# Patient Record
Sex: Female | Born: 1985 | Race: White | Hispanic: No | Marital: Single | Smoking: Never smoker
Health system: Southern US, Community
[De-identification: ages and names within clinical notes are randomized; demographics above are authoritative.]

## PROBLEM LIST (undated history)

## (undated) DIAGNOSIS — F909 Attention-deficit hyperactivity disorder, unspecified type: Secondary | ICD-10-CM

## (undated) DIAGNOSIS — E039 Hypothyroidism, unspecified: Secondary | ICD-10-CM

## (undated) DIAGNOSIS — N912 Amenorrhea, unspecified: Secondary | ICD-10-CM

## (undated) DIAGNOSIS — F419 Anxiety disorder, unspecified: Secondary | ICD-10-CM

## (undated) DIAGNOSIS — F32A Depression, unspecified: Secondary | ICD-10-CM

## (undated) DIAGNOSIS — F840 Autistic disorder: Secondary | ICD-10-CM

## (undated) DIAGNOSIS — F329 Major depressive disorder, single episode, unspecified: Secondary | ICD-10-CM

## (undated) HISTORY — PX: WISDOM TOOTH EXTRACTION: SHX21

## (undated) HISTORY — DX: Major depressive disorder, single episode, unspecified: F32.9

## (undated) HISTORY — DX: Autistic disorder: F84.0

## (undated) HISTORY — DX: Hypothyroidism, unspecified: E03.9

## (undated) HISTORY — PX: TYMPANOPLASTY: SHX33

## (undated) HISTORY — DX: Amenorrhea, unspecified: N91.2

## (undated) HISTORY — DX: Depression, unspecified: F32.A

---
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
## 2005-01-13 ENCOUNTER — Ambulatory Visit: Payer: Self-pay | Admitting: Family Medicine

## 2005-05-15 ENCOUNTER — Ambulatory Visit: Payer: Self-pay | Admitting: Family Medicine

## 2012-06-24 ENCOUNTER — Other Ambulatory Visit: Payer: Self-pay

## 2012-06-24 ENCOUNTER — Other Ambulatory Visit: Payer: Self-pay | Admitting: Otolaryngology

## 2012-06-24 DIAGNOSIS — J329 Chronic sinusitis, unspecified: Secondary | ICD-10-CM

## 2012-06-27 ENCOUNTER — Inpatient Hospital Stay: Admission: RE | Admit: 2012-06-27 | Payer: Self-pay | Source: Ambulatory Visit

## 2012-06-30 ENCOUNTER — Other Ambulatory Visit: Payer: Self-pay

## 2012-07-04 ENCOUNTER — Ambulatory Visit
Admission: RE | Admit: 2012-07-04 | Discharge: 2012-07-04 | Disposition: A | Discharge status: Home / Self Care | Payer: BC Managed Care – PPO | Source: Ambulatory Visit | Attending: Otolaryngology | Admitting: Otolaryngology

## 2012-07-04 DIAGNOSIS — J329 Chronic sinusitis, unspecified: Secondary | ICD-10-CM

## 2012-07-05 ENCOUNTER — Encounter (HOSPITAL_COMMUNITY): Payer: Self-pay | Admitting: Pharmacist

## 2012-07-08 ENCOUNTER — Encounter (HOSPITAL_COMMUNITY): Payer: Self-pay | Admitting: *Deleted

## 2012-07-15 ENCOUNTER — Encounter (HOSPITAL_COMMUNITY): Payer: Self-pay | Admitting: Obstetrics and Gynecology

## 2012-07-15 NOTE — H&P (Addendum)
Jacqueline Odonnell is an 26 y.o. female.   Chief Complaint: 26 yo SWF G0P0 can't use tampons HPI: menses regular but pt unable to find an opening at vagina to use tampons  Past Medical History  Diagnosis Date  . Anxiety   . Hypothyroidism   . Autism spectrum disorder   . ADHD (attention deficit hyperactivity disorder)     Past Surgical History  Procedure Date  . Wisdom tooth extraction   . Tympanoplasty     History reviewed. No pertinent family history. Social History:  reports that she has never smoked. She has never used smokeless tobacco. She reports that she does not drink alcohol or use illicit drugs.  Allergies:  Allergies  Allergen Reactions  . Mineral Oil Rash    No prescriptions prior to admission    No results found for this or any previous visit (from the past 48 hour(s)). No results found.  Review of Systems  All other systems reviewed and are negative.    There were no vitals taken for this visit. Physical Exam  Constitutional: She is oriented to person, place, and time. She appears well-developed and well-nourished.  HENT:  Head: Normocephalic and atraumatic.  Eyes: Conjunctivae normal are normal.  Neck: Neck supple. No thyromegaly present.  Cardiovascular: Normal rate, regular rhythm and normal heart sounds.   Respiratory: Effort normal and breath sounds normal.  GI: Soft. Bowel sounds are normal.  Genitourinary: Uterus normal.       Unable to ascertain a vaginal opening.  Rectal exam does reveal a palpably no uterus and cervix and no adnexal masses  Musculoskeletal: Normal range of motion.  Neurological: She is alert and oriented to person, place, and time.  Skin: Skin is warm and dry.  Psychiatric: She has a normal mood and affect.     Assessment/Plan microperforate hymen, for hymenotomy.    ROMINE,CYNTHIA P 07/15/2012, 1:26 PM   No change in H&P

## 2012-07-18 ENCOUNTER — Encounter (HOSPITAL_COMMUNITY): Payer: Self-pay | Admitting: Anesthesiology

## 2012-07-18 ENCOUNTER — Encounter (HOSPITAL_COMMUNITY)
Admission: RE | Disposition: A | Discharge status: Home / Self Care | Payer: Self-pay | Source: Ambulatory Visit | Attending: Gynecology

## 2012-07-18 ENCOUNTER — Ambulatory Visit (HOSPITAL_COMMUNITY): Payer: BC Managed Care – PPO | Admitting: Anesthesiology

## 2012-07-18 ENCOUNTER — Ambulatory Visit (HOSPITAL_COMMUNITY)
Admission: RE | Admit: 2012-07-18 | Discharge: 2012-07-18 | Disposition: A | Discharge status: Home / Self Care | Payer: BC Managed Care – PPO | Source: Ambulatory Visit | Attending: Gynecology | Admitting: Gynecology

## 2012-07-18 DIAGNOSIS — Z01419 Encounter for gynecological examination (general) (routine) without abnormal findings: Secondary | ICD-10-CM | POA: Insufficient documentation

## 2012-07-18 DIAGNOSIS — Q523 Imperforate hymen: Secondary | ICD-10-CM | POA: Insufficient documentation

## 2012-07-18 HISTORY — DX: Autistic disorder: F84.0

## 2012-07-18 HISTORY — PX: EXAMINATION UNDER ANESTHESIA: SHX1540

## 2012-07-18 HISTORY — DX: Hypothyroidism, unspecified: E03.9

## 2012-07-18 HISTORY — PX: HYMENECTOMY: SHX5853

## 2012-07-18 HISTORY — DX: Anxiety disorder, unspecified: F41.9

## 2012-07-18 HISTORY — DX: Attention-deficit hyperactivity disorder, unspecified type: F90.9

## 2012-07-18 LAB — CBC
MCH: 27.4 pg (ref 26.0–34.0)
MCV: 82 fL (ref 78.0–100.0)
Platelets: 390 10*3/uL (ref 150–400)
RDW: 14.3 % (ref 11.5–15.5)
WBC: 9.1 10*3/uL (ref 4.0–10.5)

## 2012-07-18 LAB — HCG, SERUM, QUALITATIVE: Preg, Serum: NEGATIVE

## 2012-07-18 SURGERY — EXAM UNDER ANESTHESIA
Anesthesia: Monitor Anesthesia Care | Site: Vagina | Wound class: Clean Contaminated

## 2012-07-18 MED ORDER — LIDOCAINE HCL (CARDIAC) 20 MG/ML IV SOLN
INTRAVENOUS | Status: DC | PRN
  Administered 2012-07-18: 20 mg via INTRAVENOUS

## 2012-07-18 MED ORDER — ESTRADIOL 0.1 MG/GM VA CREA
1.0000 | TOPICAL_CREAM | Freq: Once | VAGINAL | Status: AC
  Administered 2012-07-18: 1 via VAGINAL

## 2012-07-18 MED ORDER — METOCLOPRAMIDE HCL 5 MG/ML IJ SOLN: 10.0000 mg | Freq: Once | INTRAMUSCULAR | Status: DC | PRN

## 2012-07-18 MED ORDER — ESTRADIOL 0.1 MG/GM VA CREA
TOPICAL_CREAM | VAGINAL | Status: AC
  Filled 2012-07-18: qty 42.5

## 2012-07-18 MED ORDER — MEPERIDINE HCL 25 MG/ML IJ SOLN: 6.2500 mg | INTRAMUSCULAR | Status: DC | PRN

## 2012-07-18 MED ORDER — MIDAZOLAM HCL 2 MG/2ML IJ SOLN
INTRAMUSCULAR | Status: AC
  Filled 2012-07-18: qty 2

## 2012-07-18 MED ORDER — PROPOFOL 10 MG/ML IV EMUL
INTRAVENOUS | Status: DC | PRN
  Administered 2012-07-18: 140 ug/kg/min via INTRAVENOUS

## 2012-07-18 MED ORDER — LIDOCAINE HCL 2 % IJ SOLN
INTRAMUSCULAR | Status: AC
  Filled 2012-07-18: qty 20

## 2012-07-18 MED ORDER — LACTATED RINGERS IV SOLN
INTRAVENOUS | Status: DC
  Administered 2012-07-18 (×3): via INTRAVENOUS

## 2012-07-18 MED ORDER — KETOROLAC TROMETHAMINE 30 MG/ML IJ SOLN
INTRAMUSCULAR | Status: AC
  Filled 2012-07-18: qty 1

## 2012-07-18 MED ORDER — FENTANYL CITRATE 0.05 MG/ML IJ SOLN
INTRAMUSCULAR | Status: DC | PRN
  Administered 2012-07-18 (×2): 50 ug via INTRAVENOUS
  Administered 2012-07-18 (×2): 25 ug via INTRAVENOUS

## 2012-07-18 MED ORDER — MIDAZOLAM HCL 5 MG/5ML IJ SOLN
INTRAMUSCULAR | Status: DC | PRN
  Administered 2012-07-18: 2 mg via INTRAVENOUS

## 2012-07-18 MED ORDER — ONDANSETRON HCL 4 MG/2ML IJ SOLN
INTRAMUSCULAR | Status: AC
  Filled 2012-07-18: qty 2

## 2012-07-18 MED ORDER — PROPOFOL 10 MG/ML IV EMUL
INTRAVENOUS | Status: AC
  Filled 2012-07-18: qty 20

## 2012-07-18 MED ORDER — FENTANYL CITRATE 0.05 MG/ML IJ SOLN: 25.0000 ug | INTRAMUSCULAR | Status: DC | PRN

## 2012-07-18 MED ORDER — LIDOCAINE HCL (CARDIAC) 20 MG/ML IV SOLN
INTRAVENOUS | Status: AC
  Filled 2012-07-18: qty 5

## 2012-07-18 MED ORDER — FENTANYL CITRATE 0.05 MG/ML IJ SOLN
INTRAMUSCULAR | Status: AC
  Filled 2012-07-18: qty 4

## 2012-07-18 MED ORDER — PROPOFOL 10 MG/ML IV EMUL
INTRAVENOUS | Status: DC | PRN
  Administered 2012-07-18 (×4): 20 mg via INTRAVENOUS
  Administered 2012-07-18: 40 mg via INTRAVENOUS

## 2012-07-18 MED ORDER — ONDANSETRON HCL 4 MG/2ML IJ SOLN
INTRAMUSCULAR | Status: DC | PRN
  Administered 2012-07-18: 4 mg via INTRAVENOUS

## 2012-07-18 MED ORDER — PROPOFOL 10 MG/ML IV EMUL
INTRAVENOUS | Status: AC
  Filled 2012-07-18: qty 40

## 2012-07-18 MED ORDER — LIDOCAINE HCL 2 % IJ SOLN
INTRAMUSCULAR | Status: DC | PRN
  Administered 2012-07-18: 10 mL

## 2012-07-18 SURGICAL SUPPLY — 18 items
DECANTER SPIKE VIAL GLASS SM (MISCELLANEOUS) ×1 IMPLANT
ELECT NDL TIP 2.8 STRL (NEEDLE) IMPLANT
ELECT NEEDLE TIP 2.8 STRL (NEEDLE) ×4 IMPLANT
ELECT REM PT RETURN 9FT ADLT (ELECTROSURGICAL) ×2
ELECTRODE REM PT RTRN 9FT ADLT (ELECTROSURGICAL) IMPLANT
GAUZE PACKING 1 X5 YD ST (GAUZE/BANDAGES/DRESSINGS) ×1 IMPLANT
GLOVE BIO SURGEON STRL SZ7 (GLOVE) ×1 IMPLANT
GLOVE ECLIPSE 6.5 STRL STRAW (GLOVE) ×1 IMPLANT
GLOVE ECLIPSE 7.0 STRL STRAW (GLOVE) ×1 IMPLANT
GOWN STRL NON-REIN LRG LVL3 (GOWN DISPOSABLE) ×3 IMPLANT
NEEDLE HYPO 22GX1.5 SAFETY (NEEDLE) ×1 IMPLANT
PACK VAGINAL MINOR WOMEN LF (CUSTOM PROCEDURE TRAY) ×1 IMPLANT
PAD OB MATERNITY 4.3X12.25 (PERSONAL CARE ITEMS) ×1 IMPLANT
PENCIL BUTTON HOLSTER BLD 10FT (ELECTRODE) ×1 IMPLANT
SUT MON AB 2-0 SH 27 (SUTURE) ×2
SUT MON AB 2-0 SH27 (SUTURE) IMPLANT
TOWEL OR 17X24 6PK STRL BLUE (TOWEL DISPOSABLE) ×2 IMPLANT
WATER STERILE IRR 1000ML POUR (IV SOLUTION) ×1 IMPLANT

## 2012-07-18 NOTE — Transfer of Care (Signed)
Immediate Anesthesia Transfer of Care Note  Patient: Jacqueline Odonnell  Procedure(s) Performed: Procedure(s) (LRB) with comments: EXAM UNDER ANESTHESIA (N/A) - with Pap Smear HYMENECTOMY (N/A)  Patient Location: PACU  Anesthesia Type:MAC  Level of Consciousness: awake, alert , oriented and patient cooperative  Airway & Oxygen Therapy: Patient Spontanous Breathing  Post-op Assessment: Report given to PACU RN and Post -op Vital signs reviewed and stable  Post vital signs: Reviewed and stable  Complications: No apparent anesthesia complications

## 2012-07-18 NOTE — Brief Op Note (Signed)
07/18/2012  12:44 PM  PATIENT:  Jacqueline Odonnell  26 y.o. female  PRE-OPERATIVE DIAGNOSIS:  Imperforated Hymen  POST-OPERATIVE DIAGNOSIS:  Imperforated Hymen  PROCEDURE:  Procedure(s) (LRB) with comments: EXAM UNDER ANESTHESIA (N/A) - with Pap Smear HYMENECTOMY (N/A)  SURGEON:  Surgeon(s) and Role:    * Bennye Alm, MD - Primary  PHYSICIAN ASSISTANT:   ASSISTANTS: none   ANESTHESIA:   local and IV sedation  EBL:  Total I/O In: 1000 [I.V.:1000] Out: 400 [Urine:400]  BLOOD ADMINISTERED:none  DRAINS: none   LOCAL MEDICATIONS USED:  LIDOCAINE 2% 10cc   SPECIMEN:  No Specimen  DISPOSITION OF SPECIMEN:  N/A  COUNTS:  YES  TOURNIQUET:  * No tourniquets in log *  DICTATION: .Other Dictation: Dictation Number [id number]  PLAN OF CARE: Discharge to home after PACU  PATIENT DISPOSITION:  PACU - hemodynamically stable.   Delay start of Pharmacological VTE agent (>24hrs) due to surgical blood loss or risk of bleeding: not applicable

## 2012-07-18 NOTE — Anesthesia Postprocedure Evaluation (Signed)
  Anesthesia Post-op Note  Patient: Jacqueline Odonnell  Procedure(s) Performed: Procedure(s) (LRB) with comments: EXAM UNDER ANESTHESIA (N/A) - with Pap Smear HYMENECTOMY (N/A)  Patient Location: PACU  Anesthesia Type:MAC  Level of Consciousness: awake, alert  and oriented  Airway and Oxygen Therapy: Patient Spontanous Breathing  Post-op Pain: none  Post-op Assessment: Post-op Vital signs reviewed, Patient's Cardiovascular Status Stable, Respiratory Function Stable, Patent Airway, No signs of Nausea or vomiting and Pain level controlled  Post-op Vital Signs: Reviewed and stable  Complications: No apparent anesthesia complications

## 2012-07-18 NOTE — Anesthesia Preprocedure Evaluation (Signed)
Anesthesia Evaluation  Patient identified by MRN, date of birth, ID band Patient awake    Reviewed: Allergy & Precautions, H&P , NPO status , Patient's Chart, lab work & pertinent test results  Airway Mallampati: I TM Distance: >3 FB Neck ROM: Full    Dental No notable dental hx. (+) Teeth Intact   Pulmonary  breath sounds clear to auscultation  Pulmonary exam normal       Cardiovascular negative cardio ROS  Rhythm:Regular Rate:Normal     Neuro/Psych PSYCHIATRIC DISORDERS Anxiety ADHD   GI/Hepatic negative GI ROS, Neg liver ROS,   Endo/Other  Hypothyroidism   Renal/GU negative Renal ROS  negative genitourinary   Musculoskeletal negative musculoskeletal ROS (+)   Abdominal (+) + obese,   Peds  Hematology negative hematology ROS (+)   Anesthesia Other Findings   Reproductive/Obstetrics negative OB ROS                           Anesthesia Physical Anesthesia Plan  ASA: II  Anesthesia Plan: MAC   Post-op Pain Management:    Induction: Intravenous  Airway Management Planned: Natural Airway  Additional Equipment:   Intra-op Plan:   Post-operative Plan: Extubation in OR  Informed Consent: I have reviewed the patients History and Physical, chart, labs and discussed the procedure including the risks, benefits and alternatives for the proposed anesthesia with the patient or authorized representative who has indicated his/her understanding and acceptance.   Dental advisory given  Plan Discussed with: CRNA, Anesthesiologist and Surgeon  Anesthesia Plan Comments:         Anesthesia Quick Evaluation

## 2012-07-19 NOTE — Op Note (Signed)
NAMEMARONDA, CAISON               ACCOUNT NO.:  [id number]  MEDICAL RECORD NO.:  [id number]  LOCATION:  [facility]                          FACILITY:  [facility]  PHYSICIAN:  Ivor Costa. Farrel Gobble, M.D. DATE OF BIRTH:  April 28, 1986  DATE OF PROCEDURE:  07/18/2012 DATE OF DISCHARGE:  07/18/2012                              OPERATIVE REPORT   PREOPERATIVE DIAGNOSIS:  Imperforate hymen.  POSTOPERATIVE DIAGNOSIS:  Imperforate hymen.  PROCEDURE: 1. Exam under anesthesia. 2. Hymenectomy. 3. Pap smear.  SURGEON:  Ivor Costa. Farrel Gobble, M.D.  ASSISTANT:  None.  ANESTHESIA:  IV sedation and 2% lidocaine 10 mL local.  IV FLUIDS:  1 L Lactated Ringer's.  ESTIMATED BLOOD LOSS:  Less than 10 mL.  URINE OUTPUT:  400 mL.  FINDINGS:  Almost complete imperforate hymen was noted.  There was no outpouching of the hyaline membrane.  The rectal exam confirmed pelvic anatomy Normal cervix, anteverted uterus appreciated at the end of the procedure.  PATHOLOGY:  None.  PROCEDURE:  The patient was taken to the operating room.  IV sedation was induced and placed in the dorsal lithotomy position and prepped and draped in the usual sterile fashion.  After adequate anesthesia was assured, an extensive exam of the posterior external vaginal genitalia was performed, it appeared to be what seemed like entirely solid imperforate hymen.  However, by history, the patient did occasionally have menses.  Rectal exam was performed.  The uterosacral ligaments were felt and the cervix were palpable.  The patient was then prepped and draped in the usual sterile fashion.  Gloves were exchanged.  Using lacrimal duct dilators what look like indentations in the hymenal area where probed in a single opening was identified just inferior to the bladder.  The probe was then advanced cephalad to confirm what appeared to be normal vaginal length, which was appreciated.  The hemostat tips were able to be advanced through the opening which  was gently extended with pressure.  The tips were pointed inferiorly then using a needle tip cut on the Bovie set at 30 and incision was made into the hymen, which was noted to be markedly thick in the most superior aspect.  An examination was performed at this point and the aperture of the vagina was better delineated.  The vaginal opening was tight without give anteriorly.  The region behind the hymen was then injected with 10cc of 2% lidocaine without epi. The hymen was then held with Allis clamps and an incision was then made removing the thick membranous hymen circumferentially creating more normal vaginal opening.  The tissue was passed off.   A speculum was then placed.  A cervix was visualized.  A Pap smear was performed.  Bimanual exam was similarly performed and was noted as above.  The vaginal mucosa was then plicated to the edge of the vulva with 3-0 Monocryl.  The inferior aspect was done interrupted to not tighten the vaginal opening.  The suture line was treated with packing with Estrace cream.  The bladder was emptied at the beginning and again at the end of the procedure.  The patient tolerated the procedure well.  Sponges, lap and needle counts were correct.  She was transferred to the recovery room in stable condition.     Ivor Costa. Farrel Gobble, M.D.     THL/MEDQ  D:  07/18/2012  T:  07/18/2012  Job:  [id number]

## 2012-07-20 ENCOUNTER — Encounter (HOSPITAL_COMMUNITY): Payer: Self-pay | Admitting: Gynecology

## 2012-10-07 ENCOUNTER — Telehealth: Payer: Self-pay | Admitting: Gynecology

## 2012-10-07 NOTE — Telephone Encounter (Signed)
PT IS HAVING CYCLE ISSUES

## 2012-10-07 NOTE — Telephone Encounter (Signed)
Patient concerned that she has not had a cycle in 2 months and has to always have progestin to make it start . Cycle is always normal , without any problems. Concerned that she always has to have progestin and her cycle will not start on its on naturally. Please advise. Jacqueline Odonnell. Will put chart on book case in office.

## 2012-10-13 ENCOUNTER — Encounter: Payer: Self-pay | Admitting: Obstetrics and Gynecology

## 2012-10-13 ENCOUNTER — Ambulatory Visit: Payer: Self-pay | Admitting: Gynecology

## 2012-10-13 ENCOUNTER — Telehealth: Payer: Self-pay | Admitting: Gynecology

## 2012-10-13 DIAGNOSIS — F329 Major depressive disorder, single episode, unspecified: Secondary | ICD-10-CM | POA: Insufficient documentation

## 2012-10-13 DIAGNOSIS — E039 Hypothyroidism, unspecified: Secondary | ICD-10-CM | POA: Insufficient documentation

## 2012-10-13 NOTE — Telephone Encounter (Signed)
Pt is still waiting on a call from Monday regarding an appt with dr lathrop.

## 2012-10-13 NOTE — Telephone Encounter (Signed)
Spoke with pt who has been unable to have a period without taking progesterone first. LMP 07-31-12. Requests OV. Sced appt with TL today at 1445. aa

## 2012-10-14 ENCOUNTER — Ambulatory Visit (INDEPENDENT_AMBULATORY_CARE_PROVIDER_SITE_OTHER): Payer: BC Managed Care – PPO | Admitting: Gynecology

## 2012-10-14 ENCOUNTER — Encounter: Payer: Self-pay | Admitting: Gynecology

## 2012-10-14 ENCOUNTER — Ambulatory Visit: Payer: Self-pay | Admitting: Gynecology

## 2012-10-14 VITALS — BP 118/60 | Resp 18 | Wt 176.0 lb

## 2012-10-14 DIAGNOSIS — F845 Asperger's syndrome: Secondary | ICD-10-CM

## 2012-10-14 DIAGNOSIS — F848 Other pervasive developmental disorders: Secondary | ICD-10-CM

## 2012-10-14 DIAGNOSIS — N912 Amenorrhea, unspecified: Secondary | ICD-10-CM

## 2012-10-14 DIAGNOSIS — N911 Secondary amenorrhea: Secondary | ICD-10-CM

## 2012-10-14 LAB — POCT URINE PREGNANCY: Preg Test, Ur: NEGATIVE

## 2012-10-14 MED ORDER — MEDROXYPROGESTERONE ACETATE 10 MG PO TABS: 10.0000 mg | ORAL_TABLET | Freq: Every day | ORAL | Status: DC

## 2012-10-14 NOTE — Progress Notes (Signed)
Subjective:     Patient ID: Jacqueline Odonnell, female   DOB: 1986/06/05, 27 y.o.   MRN: [id number]  HPI Comments: Pt is a 6y old female s/p hymenectomy December 30 for a mostly imperforate hymen, pt had history of regular menses Q31, 7d flow, heavier the fisrt few days.  Pt states that since surgery she has had 1 cycle brought on by provera.  Pt started needing progestin about 1y ago.  She has had no spontaneous cycle since January.  Pt is lesbian.  Pt had hot flashes in the past but have since resolved after discontinuing amantadine.  Pt reports history of thyroid disease last checked 1y ago, no recent change in synthoid dose.  Pt reports feeling tired, low energy but doesn't feel it is related to school.  Had weight gain after stopping topamax and illness.  Reports increase in acne, hair growth and gi upset when fatigued.  Pt reports new onset chest hair on breasts-terminal, darker than hair on arms and legs.  Pt reports needing to wax facial hair q1-2 weeks, increase from the past.  Menses began at 15, regular.  Pt was already on multiple medications at that time.    Review of Systems  Constitutional: Positive for activity change and fatigue.  Gastrointestinal: Positive for nausea, abdominal pain and diarrhea. Negative for constipation and abdominal distention. Vomiting: associated with hormonal.  Endocrine: Positive for heat intolerance.  Allergic/Immunologic: Negative.   Neurological: Positive for dizziness.  Hematological: Does not bruise/bleed easily.  Psychiatric/Behavioral: Positive for sleep disturbance (unable to stay asleep wakes at 1a, poor sleep).       Objective:   Physical Exam  Constitutional: She is oriented to person, place, and time. She appears well-developed and well-nourished.  Neurological: She is alert and oriented to person, place, and time.  Psychiatric: She has a normal mood and affect. Her behavior is normal. Thought content normal.  Skin:  Terminal hair on face,  chin, upper thighs and abdomen, no hair on back.  Acne scars GYN:  Normal external genitalia, BUS neg BME:  Uterus anteverted, mobile, nontender.  Adnexa without mass     Assessment:     Secondary amenorrhea Hypothyroid Ashberger's with polypharmacy    Plan:     Will check TSH, PRL, FSH Rule out metabolic syndrome-fasting insulin, glucose, testosterone, DHEAS, 17-OHP Pelvic ultrasound Discussed options to treat, withdrawal now with progestin  Over 60 min spent, >50% face to face discusing amenorrhea

## 2012-10-14 NOTE — Patient Instructions (Signed)
Secondary Amenorrhea   Secondary amenorrhea is the stopping of menstrual flow for 3 to 6 months in a female who has previously had periods. There are many possible causes. Most of these causes are not serious. Usually treating the underlying problem causing the loss of menses will return your periods to normal.  CAUSES   Some common and uncommon causes of not menstruating include:  · Malnutrition.  · Low blood sugar (hypoglycemia).  · Polycystic ovarian disease.  · Stress or fear.  · Breastfeeding.  · Hormone imbalance.  · Ovarian failure.  · Medications.  · Extreme obesity.  · Cystic fibrosis.  · Low body weight or drastic weight reduction from any cause.  · Early menopause.  · Removal of ovaries or uterus.  · Contraceptives.  · Illness.  · Long term (chronic) illnesses.  · Cushing's syndrome.  · Thyroid problems.  · Birth control pills, patches, or vaginal rings for birth control.  DIAGNOSIS   This diagnosis is made by your caregiver taking a medical history and doing a physical exam. Pregnancy must be ruled out. Often times, numerous blood tests of different hormones in the body may be measured. Urine testing may be done. Specialized x-rays may have to be done as well as measuring the body mass index (BMI).  TREATMENT   Treatment depends on the cause of the amenorrhea. If an eating disorder is present, this can be treated with an adequate diet and therapy. Chronic illnesses may improve with treatment of the illness. Overall, the outlook is good. The amenorrhea may be corrected with medications, lifestyle changes, or surgery. If the amenorrhea cannot be corrected, it is sometimes possible to create a false menstruation with medications.  Document Released: 08/17/2006 Document Revised: 09/28/2011 Document Reviewed: 06/24/2007  ExitCare® Patient Information ©2013 [employer].

## 2012-10-17 ENCOUNTER — Ambulatory Visit: Payer: Self-pay

## 2012-10-17 ENCOUNTER — Telehealth: Payer: Self-pay | Admitting: *Deleted

## 2012-10-17 ENCOUNTER — Other Ambulatory Visit: Payer: Self-pay | Admitting: Gynecology

## 2012-10-17 ENCOUNTER — Encounter: Payer: Self-pay | Admitting: *Deleted

## 2012-10-17 ENCOUNTER — Ambulatory Visit (INDEPENDENT_AMBULATORY_CARE_PROVIDER_SITE_OTHER): Payer: BC Managed Care – PPO

## 2012-10-17 DIAGNOSIS — N912 Amenorrhea, unspecified: Secondary | ICD-10-CM

## 2012-10-17 LAB — BASIC METABOLIC PANEL
BUN: 12 mg/dL (ref 6–23)
CO2: 26 mEq/L (ref 19–32)
Calcium: 9.9 mg/dL (ref 8.4–10.5)
Chloride: 103 mEq/L (ref 96–112)
Creat: 0.65 mg/dL (ref 0.50–1.10)
Glucose, Bld: 138 mg/dL — ABNORMAL HIGH (ref 70–99)
Potassium: 4.1 mEq/L (ref 3.5–5.3)
Sodium: 138 mEq/L (ref 135–145)

## 2012-10-17 NOTE — Telephone Encounter (Signed)
k

## 2012-10-17 NOTE — Telephone Encounter (Signed)
10/17/12 pt cx pus with Dr. Orland Mustard feeling well/please call back to rs/Brillion

## 2012-10-18 NOTE — Telephone Encounter (Signed)
Appointment rescheduled for 10-19-12

## 2012-10-19 ENCOUNTER — Ambulatory Visit (INDEPENDENT_AMBULATORY_CARE_PROVIDER_SITE_OTHER): Payer: BC Managed Care – PPO

## 2012-10-19 ENCOUNTER — Ambulatory Visit (INDEPENDENT_AMBULATORY_CARE_PROVIDER_SITE_OTHER): Payer: BC Managed Care – PPO | Admitting: Gynecology

## 2012-10-19 DIAGNOSIS — N912 Amenorrhea, unspecified: Secondary | ICD-10-CM

## 2012-10-19 LAB — LUTEINIZING HORMONE: LH: 8.2 m[IU]/mL

## 2012-10-19 NOTE — Progress Notes (Signed)
      Pt here for ultrasound to evaluate lack of spontaneous menses.  Pt just started provera 10mg  by history will always have withdraw bleed.  Pt had initial labs remarkable for elevated glucose of 138, normal fasting insulin 18.  LMP January 2014.  Pt is not sexually active.  Pt is on multiple medications including Latuda for mood stabilization.   Pt's ovaries with multiple follicles, no dominant.  Endometrium 6.8 trilayered appearance. Had discussion with pt regarding risks of prolonged amenorrhea, could be related to other issues.  Pt is Lesbian but not in relationship.  Contraception not required.   Will check LH level, if normal recommend either starting routine of progesterone or Progestin IUD to protect from uterine cancer, if elevated should consider OCP. Questions addressed  Pt reminded to f/u with PCP-has appt on 4/4

## 2012-10-19 NOTE — Patient Instructions (Signed)

## 2012-10-20 LAB — ANTI MULLERIAN HORMONE: AMH AssessR: 4.1 ng/mL

## 2012-10-21 ENCOUNTER — Telehealth: Payer: Self-pay | Admitting: Gynecology

## 2012-10-21 NOTE — Telephone Encounter (Signed)
Pt would like a consult appt with dr lathrop.

## 2012-10-21 NOTE — Telephone Encounter (Signed)
Patient requests consult with Dr Farrel Gobble to discuss IUD.  Appointment scheduled for 10-26-12 at 915.

## 2012-10-26 ENCOUNTER — Ambulatory Visit (INDEPENDENT_AMBULATORY_CARE_PROVIDER_SITE_OTHER): Payer: BC Managed Care – PPO | Admitting: Gynecology

## 2012-10-26 VITALS — BP 100/70 | Wt 180.0 lb

## 2012-10-26 DIAGNOSIS — E119 Type 2 diabetes mellitus without complications: Secondary | ICD-10-CM

## 2012-10-26 DIAGNOSIS — Z3009 Encounter for other general counseling and advice on contraception: Secondary | ICD-10-CM

## 2012-10-26 MED ORDER — MISOPROSTOL 200 MCG PO TABS: 200.0000 ug | ORAL_TABLET | Freq: Four times a day (QID) | ORAL | Status: DC

## 2012-10-26 NOTE — Patient Instructions (Signed)
Intrauterine Device Information  An intrauterine device (IUD) is inserted into your uterus and prevents pregnancy. There are 2 types of IUDs available:  · Copper IUD. This type of IUD is wrapped in copper wire and is placed inside the uterus. Copper makes the uterus and fallopian tubes produce a fluid that kills sperm. The copper IUD can stay in place for 10 years.  · Hormone IUD. This type of IUD contains the hormone progestin (synthetic progesterone). The hormone thickens the cervical mucus and prevents sperm from entering the uterus, and it also thins the uterine lining to prevent implantation of a fertilized egg. The hormone can weaken or kill the sperm that get into the uterus. The hormone IUD can stay in place for 5 years.  Your caregiver will make sure you are a good candidate for a contraceptive IUD. Discuss with your caregiver the possible side effects.  ADVANTAGES  · It is highly effective, reversible, long-acting, and low maintenance.  · There are no estrogen-related side effects.  · An IUD can be used when breastfeeding.  · It is not associated with weight gain.  · It works immediately after insertion.  · The copper IUD does not interfere with your female hormones.  · The progesterone IUD can make heavy menstrual periods lighter.  · The progesterone IUD can be used for 5 years.  · The copper IUD can be used for 10 years.  DISADVANTAGES  · The progesterone IUD can be associated with irregular bleeding patterns.  · The copper IUD can make your menstrual flow heavier and more painful.  · You may experience cramping and vaginal bleeding after insertion.  Document Released: 06/09/2004 Document Revised: 09/28/2011 Document Reviewed: 11/08/2010  ExitCare® Patient Information ©2013 [employer].

## 2012-10-26 NOTE — Progress Notes (Signed)
Pt with Aspergers and amenorrhea on muliple medications with amenorrhea.  Pt had labs indicating normal TSH, FSH, LH, AMH.  PT does have withdraw bleeds after progestin challenge.  She was offered either progestin monthly or progestin IUD to protect against uterine cancers.  Pt is lesbian and not in need of contraception.  She presents today to discuss IUD option.  Pt recently started on glucophage for type 2 diabetes.  Pt currently completing Provera challenge. Pt informed that glucophage may enhance ovulation. We reviewed the IUD, mechanism of action.  Pt shown sample of IUD Questions addressed.  We will plan to pretreat cervix with cytotec  At HS and am of IUD placement Length,  >50% face to face.

## 2012-10-28 ENCOUNTER — Encounter: Payer: Self-pay | Admitting: Obstetrics and Gynecology

## 2012-10-28 ENCOUNTER — Ambulatory Visit (INDEPENDENT_AMBULATORY_CARE_PROVIDER_SITE_OTHER): Payer: BC Managed Care – PPO | Admitting: Obstetrics and Gynecology

## 2012-10-28 VITALS — BP 100/64 | Ht 63.0 in | Wt 181.0 lb

## 2012-10-28 DIAGNOSIS — Z30430 Encounter for insertion of intrauterine contraceptive device: Secondary | ICD-10-CM

## 2012-10-28 NOTE — Patient Instructions (Addendum)
Intrauterine Device Insertion Most often, an intrauterine device (IUD) is inserted into the uterus to prevent pregnancy. There are 2 types of IUDs available:  Copper IUD. This type of IUD creates an environment that is not favorable to sperm survival. The mechanism of action of the copper IUD is not known for certain. It can stay in place for 10 years.  Hormone IUD. This type of IUD contains the hormone progestin (synthetic progesterone). The progestin thickens the cervical mucus and prevents sperm from entering the uterus, and it also thins the uterine lining. There is no evidence that the hormone IUD prevents implantation. The hormone IUD can stay in place for up to 5 years. An IUD is the most cost-effective birth control if left in place for the full duration. It may be removed at any time. LET YOUR CAREGIVER KNOW ABOUT:  Sensitivity to metals.  Medicines taken including herbs, eyedrops, over-the-counter medicines, and creams.  Use of steroids (by mouth or creams).  Previous problems with anesthetics or numbing medicine.  Previous gynecological surgery.  History of blood clots or clotting disorders.  Possibility of pregnancy.  Menstrual irregularities.  Concerns regarding unusual vaginal discharge or odors.  Previous experience with an IUD.  Other health problems. RISKS AND COMPLICATIONS  Accidental puncture (perforation) of the uterus.  Accidental placement of the IUD either in the muscle layer of the uterus (myometrium) or outside the uterus. If this happen, the IUD can be found essentially floating around the bowels. When this happens, the IUD must be taken out surgically.  The IUD may fall out of the uterus (expulsion). This is more common in women who have recently had a child.   Pregnancy in the fallopian tube (ectopic). BEFORE THE PROCEDURE  Schedule the IUD insertion for when you will have your menstrual period or right after, to make sure you are not pregnant.  Placement of the IUD is better tolerated shortly after a menstrual cycle.  You may need to take tests or be examined to make sure you are not pregnant.  You may be required to take a pregnancy test.  You may be required to get checked for sexually transmitted infections (STIs) prior to placement. Placing an IUD in someone who has an infection can make an infection worse.  You may be given a pain reliever to take 1 or 2 hours before the procedure.  An exam will be performed to determine the size and position of your uterus.  Ask your caregiver about changing or stopping your regular medicines. PROCEDURE   A tool (speculum) is placed in the vagina. This allows your caregiver to see the lower part of the uterus (cervix).  The cervix is prepped with a medicine that lowers the risk of infection.  You may be given a medicine to numb each side of the cervix (intracervical or paracervical block). This is used to block and control any discomfort with insertion.  A tool (uterine sound) is inserted into the uterus to determine the length of the uterine cavity and the direction the uterus may be tilted.  A slim instrument (IUD inserter) is inserted through the cervical canal and into your uterus.  The IUD is placed in the uterine cavity and the insertion device is removed.  The nylon string that is attached to the IUD, and used for eventual IUD removal, is trimmed. It is trimmed so that it lays high in the vagina, just outside the cervix. AFTER THE PROCEDURE  You may have bleeding after the  attached to the IUD, and used for eventual IUD removal, is trimmed. It is trimmed so that it lays high in the vagina, just outside the cervix.  AFTER THE PROCEDURE  · You may have bleeding after the procedure. This is normal. It varies from light spotting for a few days to menstrual-like bleeding.   · You may have mild cramping.  · Practice checking the string coming out of the cervix to make sure the IUD remains in the uterus. If you cannot feel the string, you should schedule a "string check" with your caregiver.  · If you had a hormone IUD inserted, expect that your period may be lighter or nonexistent  within a year's time (though this is not always the case). There may be delayed fertility with the hormone IUD as a result of its progesterone effect. When you are ready to become pregnant, it is suggested to have the IUD removed up to 1 year in advance.  · Yearly exams are advised.  Document Released: 03/04/2011 Document Revised: 09/28/2011 Document Reviewed: 03/04/2011  ExitCare® Patient Information ©2013 [employer].

## 2012-10-28 NOTE — Progress Notes (Addendum)
Patient ID: Jacqueline Odonnell, female   DOB: Nov 21, 1985, 27 y.o.   MRN: [id number]  [facility] INSERTION  Just took Provera challenge for ten days and had withdrawal bleed.  Menses started yesterday.  Usually has amenorrhea.  Has had negative work up - blood work and ultrasound.  Procedure  Bimanual exam - Normal external genitalia.  Uterus small and anteverted, nontender.  No adnexal masses or tenderness.  Verbal and written consent performed.  Graves speculum placed.  Sterile prep of normally appearing cervix.  Tenaculum to anterior cervix.  Uterus sounded to 7 cm.  Skyla placed without difficulty.  Strings trimmed.  No complications.  Patient had vagal reaction with nausea and diaphoresis after procedure completed.  No complications  Assesment  Skyla IUD placement.  Lot # [id number], Expiration 03/15.  Plan  Return for a recheck in 1 month with Dr. Edward Jolly.  Warning signs of heavy bleeding, pain uncontrolled by Ibuprophen or Aleve, or fever - should call the office. Will do ultrasound at that time to confirm placement of IUD.

## 2012-11-02 ENCOUNTER — Telehealth: Payer: Self-pay | Admitting: Gynecology

## 2012-11-02 NOTE — Telephone Encounter (Signed)
Pt called because she is having issues. She is having hormonal symptoms since she had her IUD placed. These symptoms are not normal for her but she thinks they are normal for a woman on her cycle. She is currently on her cycle. She received the [facility] IUD. Dr. Edward Jolly wants her to have a PUS in 1 month for IUD surveillance. When I told the patient this and tried to schedule her PUS she said she wants to talk to DR. Lathrop first. Please let me know what is decided so I know when to move forward with scheduling.  Please advise.

## 2012-11-03 ENCOUNTER — Encounter: Payer: Self-pay | Admitting: Gynecology

## 2012-11-03 ENCOUNTER — Telehealth: Payer: Self-pay | Admitting: Gynecology

## 2012-11-03 NOTE — Telephone Encounter (Signed)
Pt reports still with light cramping but tolerable, cycle stopped after 6d, pt reports some mood swings and decreased appetite, not sure if related to IUD or recently started metformin.  Pt informed that she might start to ovulate and that might contribute to her moodiness.  Questions addressed.

## 2012-11-03 NOTE — Telephone Encounter (Signed)
appt with me only ok, also should get u/s

## 2012-11-03 NOTE — Telephone Encounter (Signed)
Dr. Farrel Gobble,  I called this patient to schedule her one month recheck as you requested. She stated that Dr. Edward Jolly wants her to have an PUS as well. Please advise?

## 2012-11-08 ENCOUNTER — Ambulatory Visit (INDEPENDENT_AMBULATORY_CARE_PROVIDER_SITE_OTHER): Payer: BC Managed Care – PPO | Admitting: Gynecology

## 2012-11-08 ENCOUNTER — Encounter: Payer: Self-pay | Admitting: Gynecology

## 2012-11-08 ENCOUNTER — Telehealth: Payer: Self-pay | Admitting: Orthopedic Surgery

## 2012-11-08 VITALS — BP 116/64 | Wt 176.0 lb

## 2012-11-08 DIAGNOSIS — L709 Acne, unspecified: Secondary | ICD-10-CM

## 2012-11-08 DIAGNOSIS — E119 Type 2 diabetes mellitus without complications: Secondary | ICD-10-CM

## 2012-11-08 DIAGNOSIS — L708 Other acne: Secondary | ICD-10-CM

## 2012-11-08 DIAGNOSIS — N760 Acute vaginitis: Secondary | ICD-10-CM

## 2012-11-08 MED ORDER — ADAPALENE 0.1 % EX GEL: Freq: Every day | CUTANEOUS | Status: DC

## 2012-11-08 MED ORDER — METRONIDAZOLE 500 MG PO TABS: 500.0000 mg | ORAL_TABLET | Freq: Two times a day (BID) | ORAL | Status: DC

## 2012-11-08 NOTE — Addendum Note (Signed)
Addended by: Clide Dales R on: 11/08/2012 04:17 PM   Modules accepted: Orders

## 2012-11-08 NOTE — Patient Instructions (Addendum)
Apply differin sparingly, avoid alcohol while on flagyly

## 2012-11-08 NOTE — Telephone Encounter (Signed)
Spoke with pt's mother, Kriste Basque, who states pt called her saying she had been having cramps off and on since her IUD was placed April 11 by TL. Today pt is vomiting and can't keep food down and she is newly diagnosed as diabetic. Pt also said she has a vaginal discharge with an odor. Mom is concerned that IUD needs to be checked. Mom is leaving home to go get pt in [place]. Per SY, scheduled appt with TL at 1315 today. Advised that an [place] technician will be here to scan pt if needed. Advised Becky to check pt's blood sugar upon arrival. She plans to stop at a drugstore to buy a meter as pt does not have one yet. If pt's blood sugar is not stable and pt cannot keep anything down, mom to take pt to urgent care or ER to be checked. Otherwise, pt will be brought for 1315 appt for IUD check.

## 2012-11-08 NOTE — Progress Notes (Signed)
Pt here reporting malodorous discharge since IUD inserted 10d ago, still with spotting, dark.  Pt reports cramping since insertion but not requiring treatment.  This am had some nausea with emesis 2x after eating, Had been able to eat since.  Pt recently started on metformin, questions if she is hypoglycemia because of dizziness.  Pt denies fever, chills.  Nausea has resolved.  ROS:  Per HPI  PE: General:  No acute distress, appropriate Pelvic:   Vulva:  No lesions Vagina: menstrum noted.   Cevix:  Strings seen,  No cervical motion tenderness Uterus:  Anteverted, nontender  Whiff:  positive  Assessment/Plan:   IUD doing well  Suspect BV, will treat accordingly Keep f/u appt with  Ultrasound Dizziness on metformin- will check fingerstick r/o hypoglycemia- FS 90 Acne- cetaphil liquid differin gel

## 2012-11-14 NOTE — Telephone Encounter (Signed)
Pt is still cramoing from the IUD is this normal

## 2012-11-15 NOTE — Telephone Encounter (Signed)
Yes, can be normal, motrin is ok to take

## 2012-11-15 NOTE — Telephone Encounter (Signed)
S/w pt told her that cramping is normal and that her body is still getting used to the IUD; told her that motrin is okay to take, Pt states that tylenol is not helping and wanted to know how long the cramping lasts, I advised patient that everybody is different so it depends. Pt agrees to take motrin and see if it helps.

## 2012-11-25 ENCOUNTER — Ambulatory Visit: Payer: BC Managed Care – PPO | Admitting: Gynecology

## 2012-11-25 ENCOUNTER — Telehealth: Payer: Self-pay | Admitting: Gynecology

## 2012-11-25 NOTE — Telephone Encounter (Signed)
Pt would like to r/s the ultrasound appt scheduled for Monday.

## 2012-11-28 ENCOUNTER — Other Ambulatory Visit: Payer: BC Managed Care – PPO | Admitting: Gynecology

## 2012-11-28 ENCOUNTER — Other Ambulatory Visit: Payer: BC Managed Care – PPO

## 2012-12-01 NOTE — Telephone Encounter (Signed)
Call to patient and resched PUS to 12-06-12.

## 2012-12-05 ENCOUNTER — Other Ambulatory Visit: Payer: Self-pay | Admitting: Gynecology

## 2012-12-05 ENCOUNTER — Telehealth: Payer: Self-pay | Admitting: Gynecology

## 2012-12-05 ENCOUNTER — Ambulatory Visit (INDEPENDENT_AMBULATORY_CARE_PROVIDER_SITE_OTHER): Payer: BC Managed Care – PPO | Admitting: Gynecology

## 2012-12-05 ENCOUNTER — Ambulatory Visit (INDEPENDENT_AMBULATORY_CARE_PROVIDER_SITE_OTHER): Payer: BC Managed Care – PPO

## 2012-12-05 DIAGNOSIS — Z30430 Encounter for insertion of intrauterine contraceptive device: Secondary | ICD-10-CM

## 2012-12-05 DIAGNOSIS — R102 Pelvic and perineal pain: Secondary | ICD-10-CM

## 2012-12-05 DIAGNOSIS — N854 Malposition of uterus: Secondary | ICD-10-CM

## 2012-12-05 DIAGNOSIS — Z975 Presence of (intrauterine) contraceptive device: Secondary | ICD-10-CM

## 2012-12-05 DIAGNOSIS — N921 Excessive and frequent menstruation with irregular cycle: Secondary | ICD-10-CM

## 2012-12-05 DIAGNOSIS — T8389XA Other specified complication of genitourinary prosthetic devices, implants and grafts, initial encounter: Secondary | ICD-10-CM

## 2012-12-05 DIAGNOSIS — N949 Unspecified condition associated with female genital organs and menstrual cycle: Secondary | ICD-10-CM

## 2012-12-05 DIAGNOSIS — E282 Polycystic ovarian syndrome: Secondary | ICD-10-CM

## 2012-12-05 NOTE — Telephone Encounter (Signed)
Patient has some questions for nurse about possible iud placement.

## 2012-12-05 NOTE — Telephone Encounter (Signed)
Patient has had IUD for 5 weeks and feels like her "body is not accepting it" and she is having side effects from progesterone, like moodiness and she just thinks she needs it out.  Office visit this afternoon.

## 2012-12-05 NOTE — Progress Notes (Signed)
Pt presents for u/s to confirm proper placement of IUD.  Uterus noted to be retroverted and deviated to left making placement more difficult. Pt reports she bled after placement and now is having intermittent spotting and daily cramping that is not enough to require any treatment.  Pt states that it is more her Aspergers that causes her to fixate on the cramping but she can sometimes distract herself. I assured her that the bleeding profile and cramping are normal after IUD placement in [place].  Pt's uterus is small at 5.5cm.  She declines any rx for the cramping.  We suggested that she might become more emotional as she might begin to ovulate as she is now on metformin but that her graduation might also make her emotional. I suggested that she continue the IUD for 4 more weeks and reassess at that time before pulling it. Her mother was present for discussion and afterwards similarly agrees with the plan. Recall 4w Length discussing u/s finding and IUD management of her prolonged amenorrhea

## 2012-12-06 ENCOUNTER — Other Ambulatory Visit: Payer: Self-pay | Admitting: Gynecology

## 2012-12-06 ENCOUNTER — Other Ambulatory Visit: Payer: Self-pay

## 2012-12-15 ENCOUNTER — Ambulatory Visit (INDEPENDENT_AMBULATORY_CARE_PROVIDER_SITE_OTHER): Payer: BC Managed Care – PPO | Admitting: Gynecology

## 2012-12-15 VITALS — BP 108/66 | Wt 173.0 lb

## 2012-12-15 DIAGNOSIS — R102 Pelvic and perineal pain: Secondary | ICD-10-CM

## 2012-12-15 DIAGNOSIS — N949 Unspecified condition associated with female genital organs and menstrual cycle: Secondary | ICD-10-CM

## 2012-12-15 DIAGNOSIS — N760 Acute vaginitis: Secondary | ICD-10-CM

## 2012-12-15 LAB — POCT WET PREP (WET MOUNT)

## 2012-12-15 MED ORDER — FLUCONAZOLE 150 MG PO TABS: 150.0000 mg | ORAL_TABLET | Freq: Once | ORAL | Status: DC

## 2012-12-15 NOTE — Patient Instructions (Signed)
Use A&D, EVOO, or pierce vit E and use oil to protect skin

## 2012-12-15 NOTE — Progress Notes (Signed)
Subjective:     Patient ID: Jacqueline Odonnell, female   DOB: 1986-05-11, 27 y.o.   MRN: [id number]  HPI Comments: Pt presents today complaining of light cramps deeper, pelvic that were new to her.  Pt reports spotting has improved, still having occasional emesis and nausea almost every morning.  Pt reports vaginal itching but denies discharge.  Pt reports some odor    Review of Systems  Genitourinary: Positive for vaginal bleeding. Negative for dysuria, vaginal discharge, difficulty urinating and vaginal pain.       Objective:   Physical Exam  Constitutional: She is oriented to person, place, and time.  Neurological: She is alert and oriented to person, place, and time.  Pelvic; External genitalia: normal Vagina:  Scant dark menstrum Cervix: IUD strings seen, no tenderenss Uterus nontender Adnexa: no fullness or tenderness  Wet prep done     Assessment:     Vulvitis, nonspecific pelvic pain     Plan:     Diflucan, discussed skin protectants reviewed, nonspecific pelvic pain, not reproduced on exam. Urine culture Referred to PCP regarding nausea, related to metformin?

## 2012-12-17 LAB — URINE CULTURE: Colony Count: >=100000

## 2013-01-05 ENCOUNTER — Encounter: Payer: Self-pay | Admitting: Gynecology

## 2013-01-06 ENCOUNTER — Ambulatory Visit: Payer: BC Managed Care – PPO | Admitting: Gynecology

## 2013-01-16 ENCOUNTER — Ambulatory Visit: Payer: BC Managed Care – PPO | Admitting: Gynecology

## 2013-01-23 ENCOUNTER — Ambulatory Visit (INDEPENDENT_AMBULATORY_CARE_PROVIDER_SITE_OTHER): Payer: BC Managed Care – PPO | Admitting: Gynecology

## 2013-01-23 ENCOUNTER — Encounter: Payer: Self-pay | Admitting: Gynecology

## 2013-01-23 VITALS — BP 118/76 | HR 80 | Resp 16 | Ht 63.0 in | Wt 170.0 lb

## 2013-01-23 DIAGNOSIS — Z0000 Encounter for general adult medical examination without abnormal findings: Secondary | ICD-10-CM

## 2013-01-23 DIAGNOSIS — Z975 Presence of (intrauterine) contraceptive device: Secondary | ICD-10-CM | POA: Insufficient documentation

## 2013-01-23 DIAGNOSIS — Z01419 Encounter for gynecological examination (general) (routine) without abnormal findings: Secondary | ICD-10-CM

## 2013-01-23 NOTE — Progress Notes (Signed)
Subjective:     Patient ID: Jacqueline Odonnell, female   DOB: 07/09/1986, 26 y.o.   MRN: [id number]  HPI Comments: Pt here for follow after placement of IUD, pt reports she continues to spot with ovulation and her menses both are vary light, overall she feels that she is getting better and would like to continue IUD.  She is not sexually active, no other complaints, she is taking metformin and reports that her fingersticks are in the 90-115 range.  Pt is leaving for [place] for school for the summer and is excited to go.    Gynecologic Exam The patient's pertinent negatives include no pelvic pain or vaginal discharge. Pertinent negatives include no flank pain or hematuria.     Review of Systems  Genitourinary: Positive for vaginal bleeding (improving). Negative for hematuria, flank pain, vaginal discharge, genital sores, vaginal pain and pelvic pain.       Objective:   Physical Exam  Constitutional: She appears well-developed and well-nourished.  Pulmonary/Chest: Right breast exhibits no inverted nipple, no mass, no nipple discharge, no skin change and no tenderness. Left breast exhibits no inverted nipple, no mass, no nipple discharge, no skin change and no tenderness. Breasts are symmetrical.  Abdominal: Soft. Bowel sounds are normal.       Assessment:     IUD check breast exam     Plan:     IUD symptoms improving Normal breast exam Pt moving to [facility] for grad school, will keep visits here but interested in new GP Best of luck in [place]

## 2013-01-23 NOTE — Progress Notes (Deleted)
27 y.o. Single Caucasian female   G0P0000 here for annual exam. Pt is not currently sexually active.  She reports not using condoms on a regular basis.  First sexual activity pt has never been sexually active, 0 number of lifetime partners.     Patient's last menstrual period was 01/03/2013.          Sexually active: no  The current method of family planning is IUD.    Exercising: yes  walking qd for atleast 20-30 mins Last pap: not sure Alcohol: no Tobacco: no Drugs: no Gardisil: yes, completed: teenager  Hgb: 13.4 Urine- Unable to void.  Health Maintenance  Topic Date Due  . Influenza Vaccine  03/20/2013  . Pap Smear  07/19/2015  . Tetanus/tdap  07/20/2018    Family History  Problem Relation Age of Onset  . Thyroid disease Mother     Hypothyroidism  . Anxiety disorder Father   . Anxiety disorder Paternal Grandmother   . Osteoarthritis Paternal Grandmother   . Osteoporosis Paternal Grandmother     Patient Active Problem List   Diagnosis Date Noted  . Diabetes 10/26/2012  . Amenorrhea, secondary 10/14/2012  . Asperger syndrome 10/14/2012  . Depression   . Hypothyroid     Past Medical History  Diagnosis Date  . Anxiety   . Hypothyroidism   . Autism spectrum disorder   . ADHD (attention deficit hyperactivity disorder)   . Depression   . Autism     Aspergers Syndrome  . Hypothyroid   . Amenorrhea     Past Surgical History  Procedure Laterality Date  . Wisdom tooth extraction    . Tympanoplasty    . Examination under anesthesia  07/18/2012    Procedure: EXAM UNDER ANESTHESIA;  Surgeon: Bennye Alm, MD;  Location: [facility];  Service: Gynecology;  Laterality: N/A;  with Pap Smear  . Hymenectomy  07/18/2012    Procedure: HYMENECTOMY;  Surgeon: Bennye Alm, MD;  Location: [facility];  Service: Gynecology;  Laterality: N/A;    Allergies: Latex; Coconut flavor; and Mineral oil  Current Outpatient Prescriptions  Medication Sig Dispense Refill  . adapalene  (DIFFERIN) 0.1 % gel Apply topically at bedtime.  45 g  0  . dexmethylphenidate (FOCALIN XR) 20 MG 24 hr capsule Take 20 mg by mouth daily.      Marland Kitchen levothyroxine (SYNTHROID, LEVOTHROID) 50 MCG tablet Take 50 mcg by mouth daily.      Marland Kitchen lurasidone (LATUDA) 80 MG TABS Take 80 mg by mouth daily with breakfast.      . metFORMIN (GLUCOPHAGE) 500 MG tablet Take 500 mg by mouth daily with breakfast.      . montelukast (SINGULAIR) 10 MG tablet Take 10 mg by mouth at bedtime.      Letta Pate DELICA LANCETS 33G MISC       . ONETOUCH VERIO test strip       . PARoxetine (PAXIL) 20 MG tablet Take 20 mg by mouth every morning.      . topiramate (TOPAMAX) 100 MG tablet Take 100 mg by mouth daily.      Marland Kitchen VALERIAN PO Take by mouth at bedtime.       No current facility-administered medications for this visit.    ROS: {Ros - complete:30496}  Exam:    BP 118/76  Pulse 80  Resp 16  Ht 5\' 3"  (1.6 m)  Wt 170 lb (77.111 kg)  BMI 30.12 kg/m2  LMP 01/03/2013 Weight change: @WEIGHTCHANGE @ Last 3 height  recordings:  Ht Readings from Last 3 Encounters:  01/23/13 5\' 3"  (1.6 m)  10/28/12 5\' 3"  (1.6 m)  07/18/12 5\' 3"  (1.6 m)   General appearance: alert, cooperative and appears stated age Head: Normocephalic, without obvious abnormality, atraumatic Neck: no adenopathy, no carotid bruit, no JVD, supple, symmetrical, trachea midline and thyroid not enlarged, symmetric, no tenderness/mass/nodules Lungs: clear to auscultation bilaterally Breasts: {breast exam:13139::"normal appearance, no masses or tenderness"} Heart: regular rate and rhythm, S1, S2 normal, no murmur, click, rub or gallop Abdomen: soft, non-tender; bowel sounds normal; no masses,  no organomegaly Extremities: extremities normal, atraumatic, no cyanosis or edema Skin: Skin color, texture, turgor normal. No rashes or lesions Lymph nodes: Cervical, supraclavicular, and axillary nodes normal. no inguinal nodes palpated Neurologic: Grossly  normal   Pelvic: External genitalia:  {Exam; genitalia female:32129}              Urethra: {urethra:311719::"not indicated"}              Bartholins and Skenes: {EXAM; GYN WJXBJ:47829}                 Vagina: {vagina:315903::"normal appearing vagina with normal color and discharge, no lesions"}              Cervix: {exam; gyn cervix:30847}              Pap taken: {yes no:314532}        Bimanual Exam:  Uterus:  {uterus:315905::"uterus is normal size, shape, consistency and nontender"}                                      Adnexa:    {adnexa:311645::"not indicated"}                                      Rectovaginal: {Rectovaginal:16320}                                      Anus:  {Exam; anus:16940}  A: {Gyn assessment:5268::"well woman"} Contraceptive management     P: {plan; gyn:5269::"mammogram","pap smear","return annually or prn"} Discussed STD prevention, regular condom use. Discussed HPV vaccine risks and benefits, pt  {DOES_DOES FAO:13086} give consent    An After Visit Summary was printed and given to the patient.

## 2013-03-30 IMAGING — CT CT PARANASAL SINUSES LIMITED
1 series · 9 of 11 positions shown, 12 images · non-contrast
Comparison: None.

CLINICAL DATA: 26-year-old female allergic symptoms.  Chronic
sinusitis, congestion.

CT PARANASAL SINUS LIMITED WITHOUT CONTRAST
TECHNIQUE: Multidetector CT images of the paranasal sinuses were
obtained in a single plane without contrast.

[Series 3: coronal soft · axial · 0.33mm/px · z∈[+30,+110]mm · 9 of 11 slices shown, 12 images]
[im 2/11  brain]
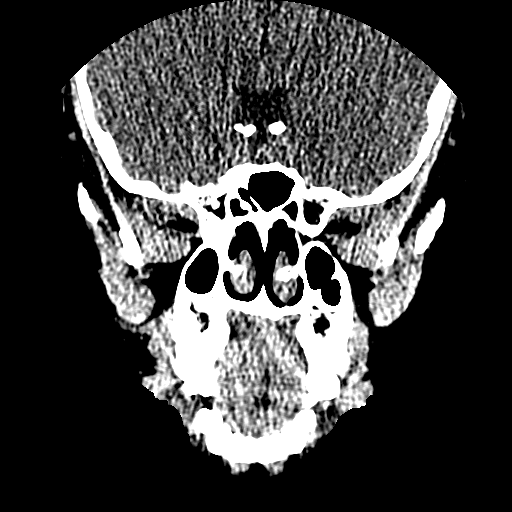
[im 2/11  bone]
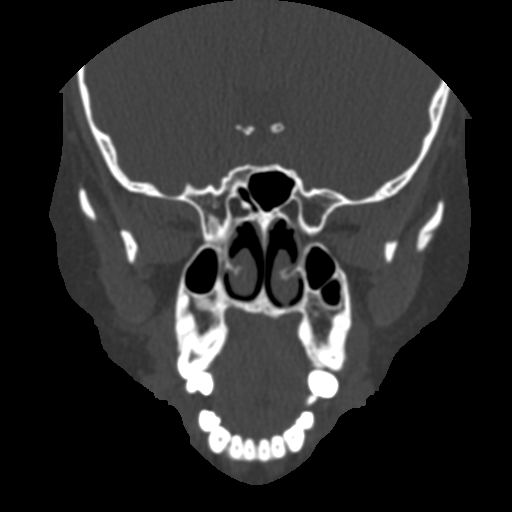
[im 3/11  bone]
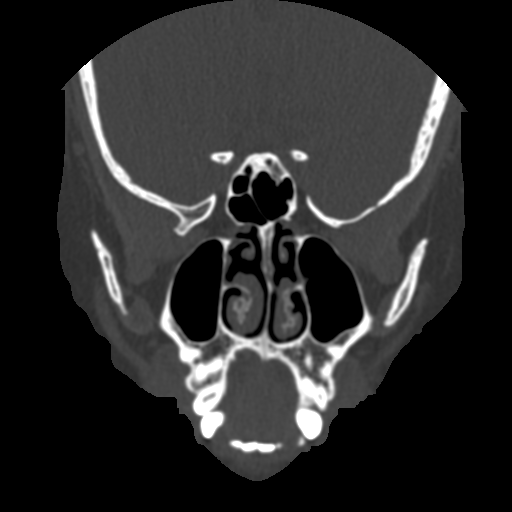
[im 4/11  bone]
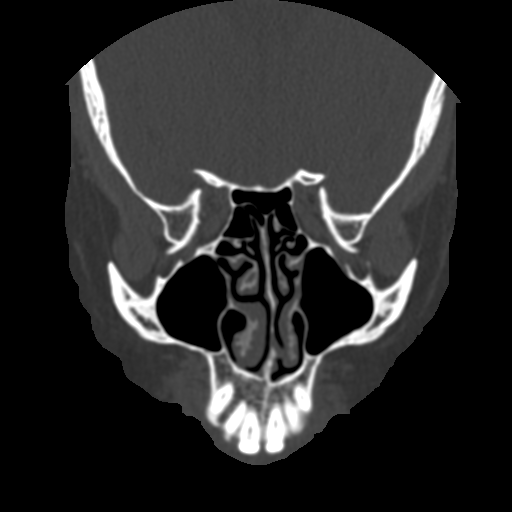
[im 5/11  bone]
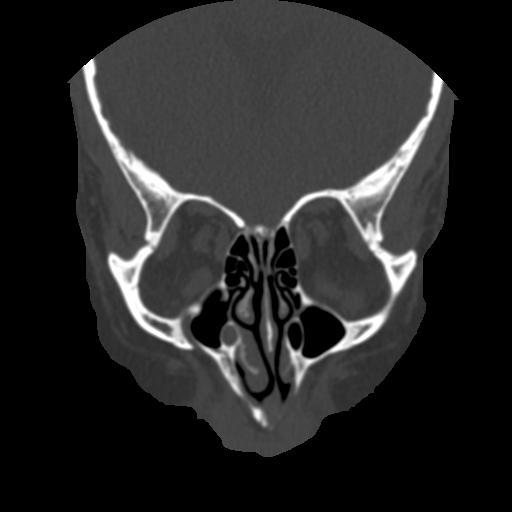
[im 6/11  brain]
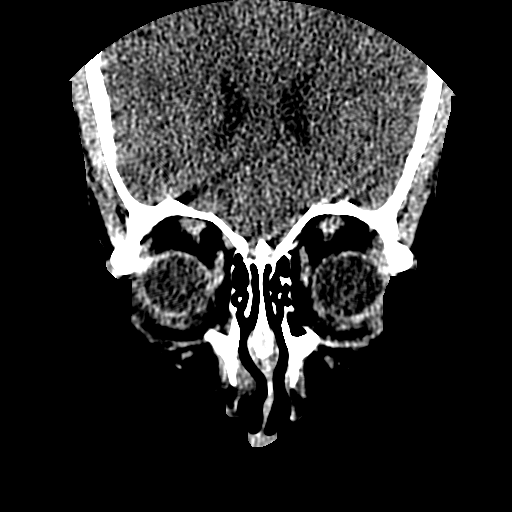
[im 6/11  bone]
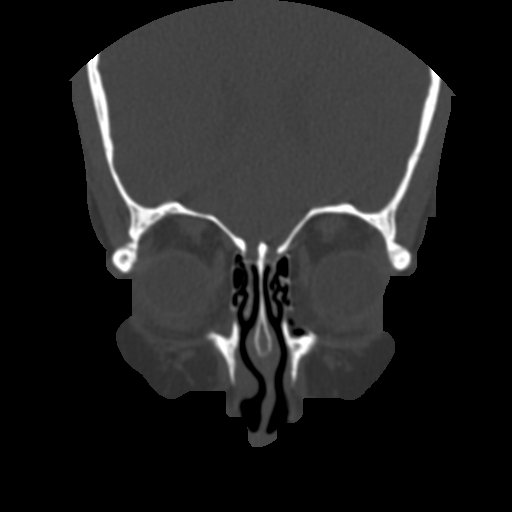
[im 7/11  bone]
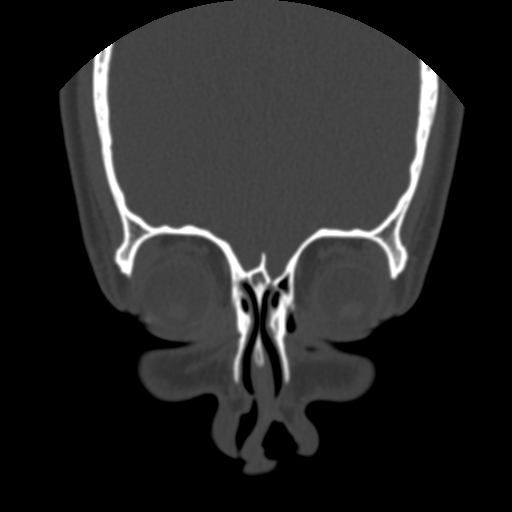
[im 8/11  bone]
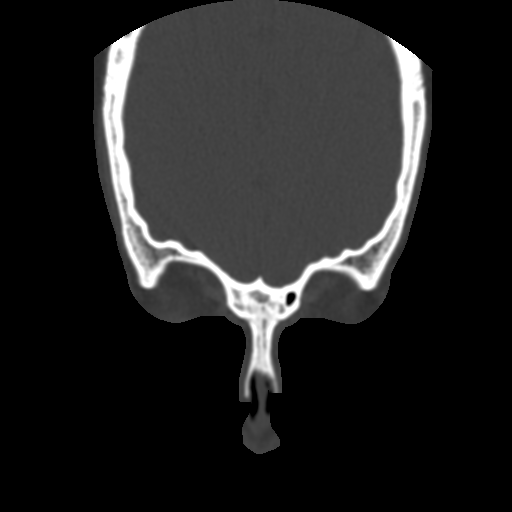
[im 9/11  bone]
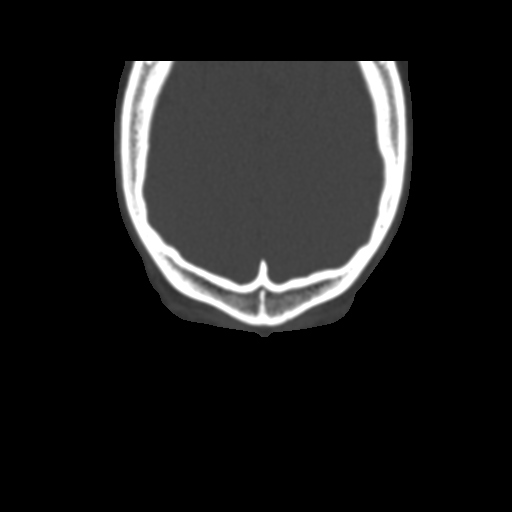
[im 10/11  brain]
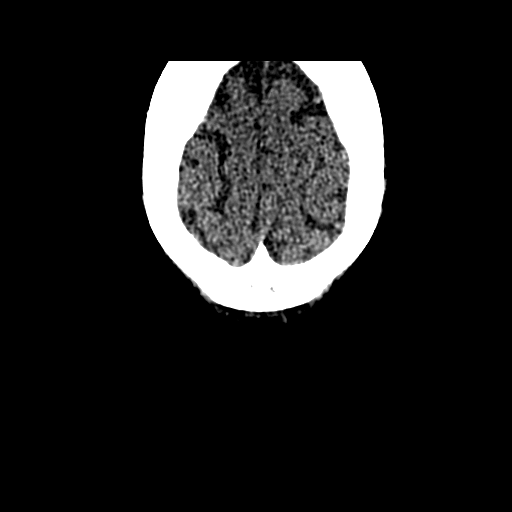
[im 10/11  bone]
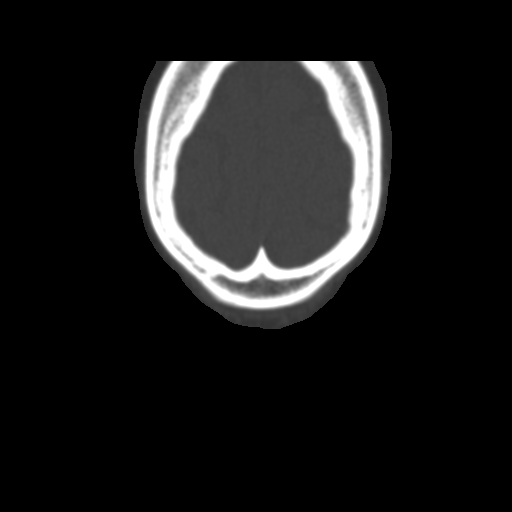

[9 of 11 positions shown; findings below may reference images not displayed]

FINDINGS: Incidental cavum septum pellucidum suspected.  Grossly
normal visualized orbit and face soft tissues.

The sphenoid sinuses are clear.
The right frontal sinus is hypoplastic and clear.  The left frontal
sinus did not develop.
The ethmoid air cells are clear.
The maxillary sinuses are clear.

No acute osseous abnormality identified.
IMPRESSION: Negative paranasal sinuses

## 2013-04-10 ENCOUNTER — Telehealth: Payer: Self-pay | Admitting: Gynecology

## 2013-04-10 NOTE — Telephone Encounter (Signed)
Returned phone call. Mother insists that the bill she received only states that she owes $35. Please advise.

## 2013-04-10 NOTE — Telephone Encounter (Signed)
Patient is calling about a bill she received about 35.00 she has a canceled check for the bill the check was written on aug 25 and cashed on august 28. She is upset because we are threating to send her to a collections agency. Wants to be called back ASAP

## 2013-05-24 ENCOUNTER — Telehealth: Payer: Self-pay | Admitting: Gynecology

## 2013-05-24 NOTE — Telephone Encounter (Signed)
Chief Complaint  Patient presents with   Appointment  pt would like an appointment to see dr lathrop for polycystic ovary syndrome.

## 2013-05-24 NOTE — Telephone Encounter (Signed)
Patient requests consult to discuss pcos. No abnormal bleeding at this time. Requests 11/24 for date. Appointment scheduled.

## 2013-06-07 ENCOUNTER — Telehealth: Payer: Self-pay | Admitting: Gynecology

## 2013-06-07 NOTE — Telephone Encounter (Signed)
Patient canceled her upcoming consult (POCS) appointment 06/12/13. Patient did not wish to reschedule.

## 2013-06-12 ENCOUNTER — Ambulatory Visit: Payer: Self-pay | Admitting: Gynecology

## 2013-06-22 NOTE — Telephone Encounter (Signed)
Pt would like to schedule an appointment for a consult with Dr. Farrel Gobble.

## 2013-06-22 NOTE — Telephone Encounter (Signed)
Patient calls requesting OV to discuss PCOS with Dr Farrel Gobble.  States she is "having symptoms again" and would like to check in with Dr Farrel Gobble.  Reports weight gain aroung waist and increased facial hair.  Appointment scheduled for 06-27-13. Declined earlier appointment due to scheduling issues.  Routing to provider for final review. Patient agreeable to disposition. Will close encounter

## 2013-06-22 NOTE — Telephone Encounter (Signed)
Patient returning Tracy's call. °

## 2013-06-22 NOTE — Telephone Encounter (Signed)
Message left to return call to Kyle Luppino at [phone].    

## 2013-06-22 NOTE — Telephone Encounter (Signed)
Return call to patient, LMTCB.  

## 2013-06-26 ENCOUNTER — Telehealth: Payer: Self-pay | Admitting: Gynecology

## 2013-06-26 NOTE — Telephone Encounter (Signed)
Patient cancelled appointment for 12/9.

## 2013-06-26 NOTE — Telephone Encounter (Signed)
Pt is living in [place] and does not have transportation will probably see another provider at her school

## 2013-06-27 ENCOUNTER — Institutional Professional Consult (permissible substitution): Payer: BC Managed Care – PPO | Admitting: Gynecology

## 2015-12-03 ENCOUNTER — Telehealth: Payer: Self-pay | Admitting: Obstetrics & Gynecology

## 2015-12-03 NOTE — Telephone Encounter (Signed)
Spoke with patient. Patient states she is unsure if she would like to have her IUD removed and not replaced or removed and replaced. Patient would like to discuss this. Skyla IUD was inserted on 10/28/2012 and was due for removal 10/29/2015. Patient states she is not sexually active. Last aex was 01/23/2013. Advised she will need to be seen for aex and can discuss IUD removal and replacement at that time. She is agreeable. Appointment scheduled for 12/06/2015 at 3 pm with Dr.Silva. She is agreeable to date and time.  Routing to provider for final review. Patient agreeable to disposition. Will close encounter.

## 2015-12-03 NOTE — Telephone Encounter (Signed)
Patient calling is due to have her IUD removed. Tried to schedule patient for her AEX she wasn't sure of her last date. Best # to reach: [phone] CC: Harland DingwallSuzy Dixon

## 2015-12-06 ENCOUNTER — Encounter: Payer: Self-pay | Admitting: Obstetrics and Gynecology

## 2015-12-06 ENCOUNTER — Ambulatory Visit (INDEPENDENT_AMBULATORY_CARE_PROVIDER_SITE_OTHER): Payer: BC Managed Care – PPO | Admitting: Obstetrics and Gynecology

## 2015-12-06 VITALS — BP 102/60 | HR 90 | Resp 20 | Ht 63.25 in | Wt 164.6 lb

## 2015-12-06 DIAGNOSIS — Z01419 Encounter for gynecological examination (general) (routine) without abnormal findings: Secondary | ICD-10-CM | POA: Diagnosis not present

## 2015-12-06 DIAGNOSIS — Z309 Encounter for contraceptive management, unspecified: Secondary | ICD-10-CM | POA: Diagnosis not present

## 2015-12-06 NOTE — Progress Notes (Signed)
30 y.o. G37P0000 Single Caucasian female here for annual exam.    Happy with the Skyla IUD overall. Had a lot of break through bleeding and cramping.  Insertion was difficult. Cycles now are irregular. Cannot track them well now.  IUD was place for PCOS. Provera made patient very emotional in the past.   Hx enlarged thyroid.  Has had thyroid ultrasound.  No bx needed.  Travels to [place].   PCP:   Juleen China, MD.  [facility].  Patient's last menstrual period was 11/17/2015 (approximate).     Period Cycle (Days):  (Irregular bleeding due to expired Skyla)     Sexually active: No. female preference. The current method of family planning is IUD--Skyla inserted 10-28-12(EXPIRED)--never sexually active.    Exercising: Yes.    walking. Smoker:  no  Health Maintenance: Pap:  07-18-12 Neg History of abnormal Pap:  no MMG:  N/A Colonoscopy:  N/A BMD:   N/A  Result  N/A TDaP:  2010 Gardasil:   yes   Screening Labs:  Hb today: PCP, Urine today: unable   reports that she has never smoked. She has never used smokeless tobacco. She reports that she does not drink alcohol or use illicit drugs.  Past Medical History  Diagnosis Date  . Anxiety   . Hypothyroidism   . Autism spectrum disorder   . ADHD (attention deficit hyperactivity disorder)   . Depression   . Autism     Aspergers Syndrome  . Hypothyroid   . Amenorrhea     Past Surgical History  Procedure Laterality Date  . Wisdom tooth extraction    . Tympanoplasty    . Examination under anesthesia  07/18/2012    Procedure: EXAM UNDER ANESTHESIA;  Surgeon: Bennye Alm, MD;  Location: [facility];  Service: Gynecology;  Laterality: N/A;  with Pap Smear  . Hymenectomy  07/18/2012    Procedure: HYMENECTOMY;  Surgeon: Bennye Alm, MD;  Location: [facility];  Service: Gynecology;  Laterality: N/A;    Current Outpatient Prescriptions  Medication Sig Dispense Refill  . ARIPiprazole (ABILIFY) 10 MG tablet Take 10  mg by mouth daily.  0  . dexmethylphenidate (FOCALIN XR) 10 MG 24 hr capsule Take 1 capsule by mouth daily.    Marland Kitchen levothyroxine (SYNTHROID, LEVOTHROID) 50 MCG tablet Take 50 mcg by mouth daily.    . metFORMIN (GLUCOPHAGE) 500 MG tablet Take 500 mg by mouth daily with breakfast.    . montelukast (SINGULAIR) 10 MG tablet Take 10 mg by mouth at bedtime.    Letta Pate DELICA LANCETS 33G MISC     . ONETOUCH VERIO test strip     . PARoxetine (PAXIL) 20 MG tablet Take 20 mg by mouth every morning.    . topiramate (TOPAMAX) 100 MG tablet Take 100 mg by mouth daily. Takes  daily    . VALERIAN PO Take by mouth at bedtime.     No current facility-administered medications for this visit.    Family History  Problem Relation Age of Onset  . Thyroid disease Mother     Hypothyroidism  . Hypertension Mother   . Anxiety disorder Father   . Hypertension Father   . Anxiety disorder Paternal Grandmother   . Osteoarthritis Paternal Grandmother   . Osteoporosis Paternal Grandmother   . Stroke Paternal Grandmother   . Cancer Maternal Grandfather 51    dec from brain aneurysm  . Stroke Maternal Grandmother   . Hypertension Paternal Grandfather   .  Stroke Paternal Grandfather     ROS:  Pertinent items are noted in HPI.  Otherwise, a comprehensive ROS was negative.  Exam:   BP 102/60 mmHg  Pulse 90  Resp 20  Ht 5' 3.25" (1.607 m)  Wt 164 lb 9.6 oz (74.662 kg)  BMI 28.91 kg/m2  LMP 11/17/2015 (Approximate)    General appearance: alert, cooperative and appears stated age Head: Normocephalic, without obvious abnormality, atraumatic Neck: no adenopathy, supple, symmetrical, trachea midline and thyroid enlarged Lungs: clear to auscultation bilaterally Breasts: normal appearance, no masses or tenderness, Inspection negative, No nipple retraction or dimpling, No nipple discharge or bleeding, No axillary or supraclavicular adenopathy Heart: regular rate and rhythm Abdomen: incisions:  No.    , soft,  non-tender; no masses, no organomegaly Extremities: extremities normal, atraumatic, no cyanosis or edema Skin: Skin color, texture, turgor normal. No rashes or lesions Lymph nodes: Cervical, supraclavicular, and axillary nodes normal. No abnormal inguinal nodes palpated Neurologic: Grossly normal  Pelvic: External genitalia:  no lesions              Urethra:  normal appearing urethra with no masses, tenderness or lesions              Bartholins and Skenes: normal                 Vagina: normal appearing vagina with normal color and discharge, no lesions              Cervix: no lesions and IUD strings noted.              Pap taken: Yes.   Bimanual Exam:  Uterus:  normal size, contour, position, consistency, mobility, non-tender              Adnexa: normal adnexa and no mass, fullness, tenderness           Chaperone was present for exam.  Assessment:   Well woman visit with normal exam. Expired Skyla IUD.  Hx PCOS.  Thyromegaly.  Status post ultrasound. On Synthroid. DM.  Plan: Yearly mammogram recommended after age 30.  Recommended self breast exam.  Pap and HR HPV as above. Discussed Calcium, Vitamin D, regular exercise program including cardiovascular and weight bearing exercise. Labs performed.  No..   Prescription medication(s) given.  No..   Return for removal of Skyla IUD Discussed Rutha BouchardKyleena IUD as an option for treatment of PCOS and irregular cycles. Written information given for patient to consider.  Follow up annually and prn.   After visit summary provided.

## 2015-12-06 NOTE — Patient Instructions (Signed)
Health Maintenance, Female Adopting a healthy lifestyle and getting preventive care can go a long way to promote health and wellness. Talk with your health care provider about what schedule of regular examinations is right for you. This is a good chance for you to check in with your provider about disease prevention and staying healthy. In between checkups, there are plenty of things you can do on your own. Experts have done a lot of research about which lifestyle changes and preventive measures are most likely to keep you healthy. Ask your health care provider for more information. WEIGHT AND DIET  Eat a healthy diet  Be sure to include plenty of vegetables, fruits, low-fat dairy products, and lean protein.  Do not eat a lot of foods high in solid fats, added sugars, or salt.  Get regular exercise. This is one of the most important things you can do for your health.  Most adults should exercise for at least 150 minutes each week. The exercise should increase your heart rate and make you sweat (moderate-intensity exercise).  Most adults should also do strengthening exercises at least twice a week. This is in addition to the moderate-intensity exercise.  Maintain a healthy weight  Body mass index (BMI) is a measurement that can be used to identify possible weight problems. It estimates body fat based on height and weight. Your health care provider can help determine your BMI and help you achieve or maintain a healthy weight.  For females 20 years of age and older:   A BMI below 18.5 is considered underweight.  A BMI of 18.5 to 24.9 is normal.  A BMI of 25 to 29.9 is considered overweight.  A BMI of 30 and above is considered obese.  Watch levels of cholesterol and blood lipids  You should start having your blood tested for lipids and cholesterol at 30 years of age, then have this test every 5 years.  You may need to have your cholesterol levels checked more often if:  Your lipid  or cholesterol levels are high.  You are older than 30 years of age.  You are at high risk for heart disease.  CANCER SCREENING   Lung Cancer  Lung cancer screening is recommended for adults 55-80 years old who are at high risk for lung cancer because of a history of smoking.  A yearly low-dose CT scan of the lungs is recommended for people who:  Currently smoke.  Have quit within the past 15 years.  Have at least a 30-pack-year history of smoking. A pack year is smoking an average of one pack of cigarettes a day for 1 year.  Yearly screening should continue until it has been 15 years since you quit.  Yearly screening should stop if you develop a health problem that would prevent you from having lung cancer treatment.  Breast Cancer  Practice breast self-awareness. This means understanding how your breasts normally appear and feel.  It also means doing regular breast self-exams. Let your health care provider know about any changes, no matter how small.  If you are in your 20s or 30s, you should have a clinical breast exam (CBE) by a health care provider every 1-3 years as part of a regular health exam.  If you are 40 or older, have a CBE every year. Also consider having a breast X-ray (mammogram) every year.  If you have a family history of breast cancer, talk to your health care provider about genetic screening.  If you   are at high risk for breast cancer, talk to your health care provider about having an MRI and a mammogram every year.  Breast cancer gene (BRCA) assessment is recommended for women who have family members with BRCA-related cancers. BRCA-related cancers include:  Breast.  Ovarian.  Tubal.  Peritoneal cancers.  Results of the assessment will determine the need for genetic counseling and BRCA1 and BRCA2 testing. Cervical Cancer Your health care provider may recommend that you be screened regularly for cancer of the pelvic organs (ovaries, uterus, and  vagina). This screening involves a pelvic examination, including checking for microscopic changes to the surface of your cervix (Pap test). You may be encouraged to have this screening done every 3 years, beginning at age 21.  For women ages 30-65, health care providers may recommend pelvic exams and Pap testing every 3 years, or they may recommend the Pap and pelvic exam, combined with testing for human papilloma virus (HPV), every 5 years. Some types of HPV increase your risk of cervical cancer. Testing for HPV may also be done on women of any age with unclear Pap test results.  Other health care providers may not recommend any screening for nonpregnant women who are considered low risk for pelvic cancer and who do not have symptoms. Ask your health care provider if a screening pelvic exam is right for you.  If you have had past treatment for cervical cancer or a condition that could lead to cancer, you need Pap tests and screening for cancer for at least 20 years after your treatment. If Pap tests have been discontinued, your risk factors (such as having a new sexual partner) need to be reassessed to determine if screening should resume. Some women have medical problems that increase the chance of getting cervical cancer. In these cases, your health care provider may recommend more frequent screening and Pap tests. Colorectal Cancer  This type of cancer can be detected and often prevented.  Routine colorectal cancer screening usually begins at 30 years of age and continues through 30 years of age.  Your health care provider may recommend screening at an earlier age if you have risk factors for colon cancer.  Your health care provider may also recommend using home test kits to check for hidden blood in the stool.  A small camera at the end of a tube can be used to examine your colon directly (sigmoidoscopy or colonoscopy). This is done to check for the earliest forms of colorectal  cancer.  Routine screening usually begins at age 50.  Direct examination of the colon should be repeated every 5-10 years through 30 years of age. However, you may need to be screened more often if early forms of precancerous polyps or small growths are found. Skin Cancer  Check your skin from head to toe regularly.  Tell your health care provider about any new moles or changes in moles, especially if there is a change in a mole's shape or color.  Also tell your health care provider if you have a mole that is larger than the size of a pencil eraser.  Always use sunscreen. Apply sunscreen liberally and repeatedly throughout the day.  Protect yourself by wearing long sleeves, pants, a wide-brimmed hat, and sunglasses whenever you are outside. HEART DISEASE, DIABETES, AND HIGH BLOOD PRESSURE   High blood pressure causes heart disease and increases the risk of stroke. High blood pressure is more likely to develop in:  People who have blood pressure in the high end   of the normal range (130-139/85-89 mm Hg).  People who are overweight or obese.  People who are African American.  If you are 38-23 years of age, have your blood pressure checked every 3-5 years. If you are 61 years of age or older, have your blood pressure checked every year. You should have your blood pressure measured twice--once when you are at a hospital or clinic, and once when you are not at a hospital or clinic. Record the average of the two measurements. To check your blood pressure when you are not at a hospital or clinic, you can use:  An automated blood pressure machine at a pharmacy.  A home blood pressure monitor.  If you are between 45 years and 39 years old, ask your health care provider if you should take aspirin to prevent strokes.  Have regular diabetes screenings. This involves taking a blood sample to check your fasting blood sugar level.  If you are at a normal weight and have a low risk for diabetes,  have this test once every three years after 30 years of age.  If you are overweight and have a high risk for diabetes, consider being tested at a younger age or more often. PREVENTING INFECTION  Hepatitis B  If you have a higher risk for hepatitis B, you should be screened for this virus. You are considered at high risk for hepatitis B if:  You were born in a country where hepatitis B is common. Ask your health care provider which countries are considered high risk.  Your parents were born in a high-risk country, and you have not been immunized against hepatitis B (hepatitis B vaccine).  You have HIV or AIDS.  You use needles to inject street drugs.  You live with someone who has hepatitis B.  You have had sex with someone who has hepatitis B.  You get hemodialysis treatment.  You take certain medicines for conditions, including cancer, organ transplantation, and autoimmune conditions. Hepatitis C  Blood testing is recommended for:  Everyone born from [birth year] through 1965.  Anyone with known risk factors for hepatitis C. Sexually transmitted infections (STIs)  You should be screened for sexually transmitted infections (STIs) including gonorrhea and chlamydia if:  You are sexually active and are younger than 30 years of age.  You are older than 30 years of age and your health care provider tells you that you are at risk for this type of infection.  Your sexual activity has changed since you were last screened and you are at an increased risk for chlamydia or gonorrhea. Ask your health care provider if you are at risk.  If you do not have HIV, but are at risk, it may be recommended that you take a prescription medicine daily to prevent HIV infection. This is called pre-exposure prophylaxis (PrEP). You are considered at risk if:  You are sexually active and do not regularly use condoms or know the HIV status of your partner(s).  You take drugs by injection.  You are sexually  active with a partner who has HIV. Talk with your health care provider about whether you are at high risk of being infected with HIV. If you choose to begin PrEP, you should first be tested for HIV. You should then be tested every 3 months for as long as you are taking PrEP.  PREGNANCY   If you are premenopausal and you may become pregnant, ask your health care provider about preconception counseling.  If you may  become pregnant, take 400 to 800 micrograms (mcg) of folic acid every day.  If you want to prevent pregnancy, talk to your health care provider about birth control (contraception). OSTEOPOROSIS AND MENOPAUSE   Osteoporosis is a disease in which the bones lose minerals and strength with aging. This can result in serious bone fractures. Your risk for osteoporosis can be identified using a bone density scan.  If you are 61 years of age or older, or if you are at risk for osteoporosis and fractures, ask your health care provider if you should be screened.  Ask your health care provider whether you should take a calcium or vitamin D supplement to lower your risk for osteoporosis.  Menopause may have certain physical symptoms and risks.  Hormone replacement therapy may reduce some of these symptoms and risks. Talk to your health care provider about whether hormone replacement therapy is right for you.  HOME CARE INSTRUCTIONS   Schedule regular health, dental, and eye exams.  Stay current with your immunizations.   Do not use any tobacco products including cigarettes, chewing tobacco, or electronic cigarettes.  If you are pregnant, do not drink alcohol.  If you are breastfeeding, limit how much and how often you drink alcohol.  Limit alcohol intake to no more than 1 drink per day for nonpregnant women. One drink equals 12 ounces of beer, 5 ounces of wine, or 1 ounces of hard liquor.  Do not use street drugs.  Do not share needles.  Ask your health care provider for help if  you need support or information about quitting drugs.  Tell your health care provider if you often feel depressed.  Tell your health care provider if you have ever been abused or do not feel safe at home.   This information is not intended to replace advice given to you by your health care provider. Make sure you discuss any questions you have with your health care provider.   Document Released: 01/19/2011 Document Revised: 07/27/2014 Document Reviewed: 06/07/2013 Elsevier Interactive Patient Education [employer].

## 2015-12-11 ENCOUNTER — Encounter: Payer: Self-pay | Admitting: Obstetrics and Gynecology

## 2015-12-11 ENCOUNTER — Ambulatory Visit (INDEPENDENT_AMBULATORY_CARE_PROVIDER_SITE_OTHER): Payer: BC Managed Care – PPO | Admitting: Obstetrics and Gynecology

## 2015-12-11 VITALS — BP 100/64 | HR 80 | Ht 63.0 in | Wt 162.4 lb

## 2015-12-11 DIAGNOSIS — Z30432 Encounter for removal of intrauterine contraceptive device: Secondary | ICD-10-CM

## 2015-12-11 DIAGNOSIS — Z309 Encounter for contraceptive management, unspecified: Secondary | ICD-10-CM | POA: Diagnosis not present

## 2015-12-11 LAB — IPS PAP TEST WITH HPV

## 2015-12-11 NOTE — Progress Notes (Signed)
Patient ID: Jacqueline Odonnell, female   DOB: 12/19/1985, 30 y.o.   MRN: [id number] GYNECOLOGY  VISIT   HPI: 30 y.o.   Single  Caucasian  female   [id number] with Patient's last menstrual period was 11/17/2015 (approximate).   here for [facility] IUD removal. Does not wish to have [place] IUD at this time. Skyla expired.   Hx of PCOS.   GYNECOLOGIC HISTORY: Patient's last menstrual period was 11/17/2015 (approximate). Contraception:  Abstinence/female partner Menopausal hormone therapy:  N/A Last mammogram:  n/a Last pap smear:   12-16-15 pending; 07-18-12 Neg        OB History    Gravida Para Term Preterm AB TAB SAB Ectopic Multiple Living           Patient Active Problem List   Diagnosis Date Noted  . IUD (intrauterine device) in place 01/23/2013  . Diabetes (HCC) 10/26/2012  . Amenorrhea, secondary 10/14/2012  . Asperger syndrome 10/14/2012  . Depression   . Hypothyroid     Past Medical History  Diagnosis Date  . Anxiety   . Hypothyroidism   . Autism spectrum disorder   . ADHD (attention deficit hyperactivity disorder)   . Depression   . Autism     Aspergers Syndrome  . Hypothyroid   . Amenorrhea     Past Surgical History  Procedure Laterality Date  . Wisdom tooth extraction    . Tympanoplasty    . Examination under anesthesia  07/18/2012    Procedure: EXAM UNDER ANESTHESIA;  Surgeon: Bennye Alm, MD;  Location: [facility];  Service: Gynecology;  Laterality: N/A;  with Pap Smear  . Hymenectomy  07/18/2012    Procedure: HYMENECTOMY;  Surgeon: Bennye Alm, MD;  Location: [facility];  Service: Gynecology;  Laterality: N/A;    Current Outpatient Prescriptions  Medication Sig Dispense Refill  . ARIPiprazole (ABILIFY) 10 MG tablet Take 10 mg by mouth daily.  0  . dexmethylphenidate (FOCALIN XR) 10 MG 24 hr capsule Take 1 capsule by mouth daily.    Marland Kitchen levothyroxine (SYNTHROID, LEVOTHROID) 50 MCG tablet Take 50 mcg by mouth daily.    . metFORMIN  (GLUCOPHAGE) 500 MG tablet Take 500 mg by mouth daily with breakfast.    . montelukast (SINGULAIR) 10 MG tablet Take 10 mg by mouth at bedtime.    Letta Pate DELICA LANCETS 33G MISC     . ONETOUCH VERIO test strip     . PARoxetine (PAXIL) 20 MG tablet Take 20 mg by mouth every morning.    . topiramate (TOPAMAX) 100 MG tablet Take 100 mg by mouth daily. Takes  daily    . VALERIAN PO Take by mouth at bedtime.     No current facility-administered medications for this visit.     ALLERGIES: Latex; Baker's yeast; Soy allergy; Tomato; Coconut flavor; and Mineral oil  Family History  Problem Relation Age of Onset  . Thyroid disease Mother     Hypothyroidism  . Hypertension Mother   . Anxiety disorder Father   . Hypertension Father   . Anxiety disorder Paternal Grandmother   . Osteoarthritis Paternal Grandmother   . Osteoporosis Paternal Grandmother   . Stroke Paternal Grandmother   . Cancer Maternal Grandfather 59    dec from brain aneurysm  . Stroke Maternal Grandmother   . Hypertension Paternal Grandfather   . Stroke Paternal Grandfather     Social History   Social History  .  Marital Status: Single    Spouse Name: N/A  . Number of Children: N/A  . Years of Education: N/A   Occupational History  . Not on file.   Social History Main Topics  . Smoking status: Never Smoker   . Smokeless tobacco: Never Used  . Alcohol Use: No  . Drug Use: No  . Sexual Activity: No     Comment: Skyla inserted 10-28-12 EXPIRED(pt.never sexually active)   Other Topics Concern  . Not on file   Social History Narrative    ROS:  Pertinent items are noted in HPI.  PHYSICAL EXAMINATION:    BP 100/64 mmHg  Pulse 80  Ht 5\' 3"  (1.6 m)  Wt 162 lb 6.4 oz (73.664 kg)  BMI 28.78 kg/m2  LMP 11/17/2015 (Approximate)    General appearance: alert, cooperative and appears stated age    Pelvic: External genitalia:  no lesions              Urethra:  normal appearing urethra with no masses,  tenderness or lesions              Bartholins and Skenes: normal                 Vagina: normal appearing vagina with normal color and discharge, no lesions              Cervix: no lesions and IUD strings seen.    IUD removal.  Consent for procedure.  Speculum place in the vagina.  Ring forceps used to remove IUD - intact, shown to patient, and discarded.            Chaperone was present for exam.  ASSESSMENT  Expired Skyla IUD.  Hx PCOS.   PLAN  IUD removed.  Will monitor cycles.  Return for irregular menses, skipped cycles, heavy bleeding, or any other concern. [facility] needs [facility]  Patient and I discussed this.  Aleve or ibuprofen for dysmenorrhea or heavy cycles.     An After Visit Summary was printed and given to the patient.  _15_____ minutes face to face time of which over 50% was spent in counseling.

## 2015-12-18 ENCOUNTER — Ambulatory Visit: Payer: BC Managed Care – PPO | Admitting: Obstetrics and Gynecology
